# Patient Record
Sex: Male | Born: 1990 | Hispanic: Yes | Marital: Married
Health system: Midwestern US, Academic
[De-identification: ages and names within clinical notes are randomized; demographics above are authoritative.]

---

## 2021-04-20 NOTE — Patient Instructions
Vasectomy Care:    *Please check-in 30 mins prior to your procedure time to avoid the risk of rescheduling*    1.    Recommended supplies:  Jock strap or supportive boxers or briefs   4x4 gauze pads  Antibiotic ointment, either Bacitracin or Neosporin ointment  Ice bag or bag of frozen peas   2.    Instructions for day of procedure:  Please clip hair on scrotum prior to arrival.  Use care not to abrade or scratch your skin.  Shower with antibacterial soap, such as Dial  Do not apply lotion to scrotum  Bring support undergarment (Jock strap or supportive boxers) with you to your appointment.  Wear loose fit clothing.  You may eat/drink as you normally would.  You will not be sedated for this procedure.  If you plan to take valium/diazepam prior to your procedure, you MUST have a driver with you.  3.    Site care  Jock strap or supportive boxers for 5 to 7 days for scrotal compression  Apply Bacitracin or Neosporin ointment to the incision twice daily for 3-5 days post procedure.  Change 4x4 gauze as needed.  May discontinue dressings when drainage stopped.                         4.    Activity  No strenuous activities for one week.    No lifting over 10 pounds for one week.    Gradually increase activity after 72 hours.  No intercourse/masturbation for one week.  5.    Bathing Instruction  You may shower after 24 hours.  No tub baths or submerging incision sites in water for or two weeks.  6.    For discomfort from the procedure:  Ice bag (or frozen peas) to scrotum OVER clothing for 20 minutes intervals (20 minutes on, 20 minutes off) for 48-72 hours following procedure.  Take the ice bag off at bedtime.  For pain control, alternate 650mg Acetaminophen every 6 hours with Ibuprofen 400mg every 6 hours, staggering 3 hours between Acetaminophen and Ibuprofen.  7.    Special instructions:  Watch for bleeding.  A small amount of drainage is normal.   Moderate bruising and swelling is to be expected. These are minimized by staying off your feet for at least 48 hours. Discomfort, swelling and bruising can last 7-14 days.   If you develop sudden or severe pain, swelling, or temperature over 101 degrees, contact your doctor's office immediately @ [phone].For evenings, nights, weekends, and holidays, go to the emergency room or call [phone] and ask for the on-call urology resident to be paged.   8.    Birth Control  You must use birth control until after sterility is confirmed by semen analysis.  You will need to provide a sample for semen analysis after 3 months post vasectomy.   9.   Semen Analysis:  Orders for semen analysis will be placed after procedure.    For 3 month post vasectomy semen analysis appointment, please call no later than the date of your vasectomy. Appointment times fill quickly, make your appointment in advance, to avoid delays in results:               [facility] Phone: [phone]                [address], [address] [place], [place].  [id number]

## 2021-04-25 ENCOUNTER — Encounter: Admit: 2021-04-25 | Discharge: 2021-04-25 | Payer: No Typology Code available for payment source

## 2021-04-25 DIAGNOSIS — Z302 Encounter for sterilization: Secondary | ICD-10-CM

## 2021-04-25 DIAGNOSIS — Z3009 Encounter for other general counseling and advice on contraception: Secondary | ICD-10-CM

## 2021-04-25 MED ORDER — DIAZEPAM 5 MG PO TAB
ORAL_TABLET | ORAL | 0 refills | 7.00000 days | Status: AC
Start: 2021-04-25 — End: ?

## 2021-04-25 NOTE — Progress Notes
Name: Samuel Alvarez          MRN: [id number]      DOB: November 14, 1990      AGE: 31 y.o.   DATE OF SERVICE: 04/25/2021    Subjective:             Reason for Visit: Vasectomy Consult  No chief complaint on file.      Samuel Alvarez is a 31 y.o. male.     Cancer Staging   No matching staging information was found for the patient.    History of Present Illness  Samuel Alvarez presents today for counseling regarding elective sterilization through vasectomy.  He is 31 years of age.  He has been married for 12 years.  His wife is 71 years of age.  They have 4 children.  They are aware of the permanent nature of the procedure.  He does not report any GU history to include hernia hydrocele varicocele undescended testicle infection or trauma.    There is no past medical or surgical history.    He takes no medications reports no known drug allergies.    He is a non-smoker social drinker.  Family history is noncontributory.       Review of Systems   All other systems reviewed and are negative.  To include head, neck, cardiac, pulmonary, GI, GU, musculoskeletal, neurologic and endocrine.      Objective:         ? DM/p-ephed/acetaminoph/doxylam (NYQUIL PO) Take  by mouth.     Vitals:    04/25/21 0907 04/25/21 0910   BP: 136/73    BP Source: Arm, Right Upper    Pulse: 63    Temp: 37.1 ?C (98.7 ?F)    Resp: 18    SpO2: 100%    TempSrc: Temporal    PainSc:  Zero   Weight: 96.9 kg (213 lb 9.6 oz)    Height: 175 cm (5' 8.9)  Comment: W/SHOE ON      Body mass index is 31.64 kg/m?Marland Kitchen     Pain Score: Zero       Fatigue Scale: 0-None      Physical Exam  Vitals and nursing note reviewed.   Constitutional:       General: He is not in acute distress.     Appearance: Normal appearance. He is well-developed and normal weight. He is not diaphoretic.   HENT:      Head: Normocephalic and atraumatic.      Nose: Nose normal.   Eyes:      General: No scleral icterus.        Right eye: No discharge.         Left eye: No discharge.      Conjunctiva/sclera: Conjunctivae normal.   Cardiovascular:      Rate and Rhythm: Normal rate.   Pulmonary:      Effort: Pulmonary effort is normal.   Abdominal:      Palpations: Abdomen is soft.      Hernia: No hernia is present.   Genitourinary:     Penis: Normal.       Testes: Normal.      Comments: Vas palp bilaterally  Musculoskeletal:      Cervical back: Normal range of motion.   Skin:     General: Skin is warm and dry.      Coloration: Skin is not pale.      Findings: No erythema or rash.   Neurological:      Mental  Status: He is alert and oriented to person, place, and time.   Psychiatric:         Behavior: Behavior normal.         Thought Content: Thought content normal.         Judgment: Judgment normal.              Assessment and Plan:  In summary we have a 31 year old gentleman seeking elective sterilization.  Risks, benefits and complications of vasectomy were discussed including but not limited to bleeding, infection, granuloma formation, chronic pain and failure.  All questions were answered to his satisfaction.  He will consider his options and schedule with his convenience should he decide.

## 2021-04-26 NOTE — Patient Instructions
Vasectomy Care:    *Please check-in 30 mins prior to your procedure time to avoid the risk of rescheduling*    1.    Recommended supplies:  Jock strap or supportive boxers or briefs   4x4 gauze pads  Antibiotic ointment, either Bacitracin or Neosporin ointment  Ice bag or bag of frozen peas   2.    Instructions for day of procedure:  Please clip hair on scrotum prior to arrival.  Use care not to abrade or scratch your skin.  Shower with antibacterial soap, such as Dial  Do not apply lotion to scrotum  Bring support undergarment (Jock strap or supportive boxers) with you to your appointment.  Wear loose fit clothing.  You may eat/drink as you normally would.  You will not be sedated for this procedure.  If you plan to take valium/diazepam prior to your procedure, you MUST have a driver with you.  3.    Site care  Jock strap or supportive boxers for 5 to 7 days for scrotal compression  Apply Bacitracin or Neosporin ointment to the incision twice daily for 3-5 days post procedure.  Change 4x4 gauze as needed.  May discontinue dressings when drainage stopped.                         4.    Activity  No strenuous activities for one week.    No lifting over 10 pounds for one week.    Gradually increase activity after 72 hours.  No intercourse/masturbation for one week.  5.    Bathing Instruction  You may shower after 24 hours.  No tub baths or submerging incision sites in water for or two weeks.  6.    For discomfort from the procedure:  Ice bag (or frozen peas) to scrotum OVER clothing for 20 minutes intervals (20 minutes on, 20 minutes off) for 48-72 hours following procedure.  Take the ice bag off at bedtime.  For pain control, alternate 650mg Acetaminophen every 6 hours with Ibuprofen 400mg every 6 hours, staggering 3 hours between Acetaminophen and Ibuprofen.  7.    Special instructions:  Watch for bleeding.  A small amount of drainage is normal.   Moderate bruising and swelling is to be expected. These are minimized by staying off your feet for at least 48 hours. Discomfort, swelling and bruising can last 7-14 days.   If you develop sudden or severe pain, swelling, or temperature over 101 degrees, contact your doctor's office immediately @ [phone].For evenings, nights, weekends, and holidays, go to the emergency room or call [phone] and ask for the on-call urology resident to be paged.   8.    Birth Control  You must use birth control until after sterility is confirmed by semen analysis.  You will need to provide a sample for semen analysis after 3 months post vasectomy.   9.   Semen Analysis:  Orders for semen analysis will be placed after procedure.    For 3 month post vasectomy semen analysis appointment, please call no later than the date of your vasectomy. Appointment times fill quickly, make your appointment in advance, to avoid delays in results:               [facility] Phone: [phone]                [address], [address] [place], [place].  [id number]

## 2021-04-28 ENCOUNTER — Encounter: Admit: 2021-04-28 | Discharge: 2021-04-28 | Payer: No Typology Code available for payment source

## 2021-04-28 DIAGNOSIS — Z302 Encounter for sterilization: Secondary | ICD-10-CM

## 2021-04-28 NOTE — Procedures
Cleaned prepped and draped in a sterile fashion.  Right vas deferens isolated subcutaneously under the midline raphae 2 to 3 cm below the penoscrotal junction.  Skin and surrounding tissue infiltrated 1% lidocaine.  1 cm skin incision made.  Vas deferens isolated with blunt dissection.  Grasped with blunt-tipped ring tipped forceps and elevated into the incision.  Vas sheath penetrated and stripped for a length of 3 to 4 cm.  2-0 silk free ties placed proximally and distally.  Intervening segment excised and sent to pathology as specimen A.  Mucosa sealed with electrocautery.  Vas allowed to retract into the right hemiscrotum.  Palpation confirmed gap.    Identical procedure performed on the left through the same incision.  Wound closed with a single vertical mattress stitch of 4-0 chromic.  Area cleaned and dressed with bacitracin, sterile fluff gauze and scrotal support.  He tolerated the procedure well.  There were no complications.  There was no blood loss.  Specimens include a) right vas and b) left vas.

## 2021-04-28 NOTE — Progress Notes
31 year old gentleman seeking elective sterilization.  Risks, benefits and complications of vasectomy were discussed including but not limited to bleeding, infection, granuloma formation, chronic pain and failure.  All questions were answered to his satisfaction.  He returns today for the procedure.    See procedure note    A/P  Uncomplicated bilateral vasectomy.  Post procedure instructions provided.  He will follow-up in 3 months with semen analysis

## 2021-07-31 ENCOUNTER — Encounter: Admit: 2021-07-31 | Discharge: 2021-07-31 | Payer: No Typology Code available for payment source
